# Patient Record
Sex: Male | Born: 1950 | Race: White | Hispanic: No | Marital: Married | Smoking: Former smoker
Health system: Southern US, Community
[De-identification: ages and names within clinical notes are randomized; demographics above are authoritative.]

## PROBLEM LIST (undated history)

## (undated) DIAGNOSIS — R972 Elevated prostate specific antigen [PSA]: Secondary | ICD-10-CM

## (undated) DIAGNOSIS — Z972 Presence of dental prosthetic device (complete) (partial): Secondary | ICD-10-CM

## (undated) DIAGNOSIS — N400 Enlarged prostate without lower urinary tract symptoms: Secondary | ICD-10-CM

## (undated) DIAGNOSIS — E785 Hyperlipidemia, unspecified: Secondary | ICD-10-CM

## (undated) HISTORY — PX: CHOLECYSTECTOMY: SHX55

## (undated) HISTORY — PX: HERNIA REPAIR: SHX51

## (undated) HISTORY — PX: OTHER SURGICAL HISTORY: SHX169

---

## 2002-01-17 ENCOUNTER — Encounter: Payer: Self-pay | Admitting: Gastroenterology

## 2002-01-17 ENCOUNTER — Ambulatory Visit (HOSPITAL_COMMUNITY): Admission: RE | Admit: 2002-01-17 | Discharge: 2002-01-17 | Payer: Self-pay | Admitting: Gastroenterology

## 2002-02-06 ENCOUNTER — Encounter (INDEPENDENT_AMBULATORY_CARE_PROVIDER_SITE_OTHER): Payer: Self-pay | Admitting: Specialist

## 2002-02-06 ENCOUNTER — Observation Stay (HOSPITAL_COMMUNITY): Admission: RE | Admit: 2002-02-06 | Discharge: 2002-02-07 | Payer: Self-pay | Admitting: Surgery

## 2002-02-06 ENCOUNTER — Encounter: Payer: Self-pay | Admitting: Surgery

## 2002-03-08 ENCOUNTER — Encounter (INDEPENDENT_AMBULATORY_CARE_PROVIDER_SITE_OTHER): Payer: Self-pay | Admitting: Specialist

## 2002-03-08 ENCOUNTER — Ambulatory Visit (HOSPITAL_COMMUNITY): Admission: RE | Admit: 2002-03-08 | Discharge: 2002-03-08 | Payer: Self-pay | Admitting: Gastroenterology

## 2002-06-27 ENCOUNTER — Ambulatory Visit (HOSPITAL_BASED_OUTPATIENT_CLINIC_OR_DEPARTMENT_OTHER): Admission: RE | Admit: 2002-06-27 | Discharge: 2002-06-28 | Payer: Self-pay | Admitting: Orthopaedic Surgery

## 2005-03-25 ENCOUNTER — Ambulatory Visit (HOSPITAL_COMMUNITY): Admission: RE | Admit: 2005-03-25 | Discharge: 2005-03-25 | Payer: Self-pay | Admitting: Gastroenterology

## 2009-03-18 ENCOUNTER — Ambulatory Visit (HOSPITAL_COMMUNITY): Admission: RE | Admit: 2009-03-18 | Discharge: 2009-03-18 | Payer: Self-pay | Admitting: Surgery

## 2011-02-03 LAB — BASIC METABOLIC PANEL
BUN: 17 mg/dL (ref 6–23)
CO2: 29 mEq/L (ref 19–32)
Calcium: 9.6 mg/dL (ref 8.4–10.5)
Chloride: 103 mEq/L (ref 96–112)
Creatinine, Ser: 0.96 mg/dL (ref 0.4–1.5)
GFR calc Af Amer: >60 mL/min (ref >60)
GFR calc non Af Amer: >60 mL/min (ref >60)
Glucose, Bld: 101 mg/dL — ABNORMAL HIGH (ref 70–99)
Potassium: 4.2 mEq/L (ref 3.5–5.1)
Sodium: 140 mEq/L (ref 135–145)

## 2011-02-03 LAB — DIFFERENTIAL
Basophils Absolute: 0 10*3/uL (ref 0.0–0.1)
Basophils Relative: 1 % (ref 0–1)
Eosinophils Absolute: 0.2 10*3/uL (ref 0.0–0.7)
Eosinophils Relative: 3 % (ref 0–5)
Lymphocytes Relative: 25 % (ref 12–46)
Lymphs Abs: 1.5 10*3/uL (ref 0.7–4.0)
Monocytes Absolute: 0.6 10*3/uL (ref 0.1–1.0)
Monocytes Relative: 9 % (ref 3–12)
Neutro Abs: 3.9 10*3/uL (ref 1.7–7.7)
Neutrophils Relative %: 62 % (ref 43–77)

## 2011-02-03 LAB — CBC
HCT: 47.3 % (ref 39.0–52.0)
Hemoglobin: 16.3 g/dL (ref 13.0–17.0)
MCHC: 34.5 g/dL (ref 30.0–36.0)
MCV: 92.1 fL (ref 78.0–100.0)
Platelets: 161 10*3/uL (ref 150–400)
RBC: 5.14 MIL/uL (ref 4.22–5.81)
RDW: 13 % (ref 11.5–15.5)
WBC: 6.3 10*3/uL (ref 4.0–10.5)

## 2011-03-10 NOTE — Op Note (Signed)
NAMECADEN, FUKUSHIMA NO.:  [id number]   MEDICAL RECORD NO.:  [id number]          PATIENT TYPE:  AMB   LOCATION:  SDS                          FACILITY:  [facility]   PHYSICIAN:  Wilmon Arms. Corliss Skains, M.D. DATE OF BIRTH:  1951-09-07   DATE OF PROCEDURE:  03/18/2009  DATE OF DISCHARGE:  03/18/2009                               OPERATIVE REPORT   PREOPERATIVE DIAGNOSIS:  Left inguinal hernia.   POSTOPERATIVE DIAGNOSIS:  Left inguinal hernia.   PROCEDURE PERFORMED:  Left inguinal hernia repair with mesh.   SURGEON:  Wilmon Arms. Tsuei, MD   ANESTHESIA:  General   INDICATIONS:  This is a 60 year old male who presents after doing some  heavy yard work, and noticed a pulling in his left groin.  He later  noticed a bulge in this area.  He was examined and was felt to have a  left inguinal hernia.  He presents now for elective repair.   DESCRIPTION OF PROCEDURE:  The patient was brought to the operating  room, placed in the supine position on operating table.  After an  adequate level of general anesthesia was obtained, the patient's left  groin was prepped with Betadine and draped in sterile fashion.  Unfortunately, the patient had previously shaved his own groin and had  some mild folliculitis around some of the hair follicles.  He was not  instructed to shave himself.  A time-out was taken to assure proper  patient, proper procedure.  We made an oblique incision above the left  inguinal ligament after infiltrating 0.25% Marcaine with epinephrine.  Dissection was carried down to the fascia.  The external oblique fascia  was opened along the direction of its fibers down to the external ring.  The spermatic cord was circumferentially dissected and was retracted  with a Penrose drain.  The floor of the inguinal canal was intact, but  was quite lax.  We skeletonized the spermatic cord.  A large indirect  hernia sac was identified and was dissected free and was reduced up  through the internal ring.  The internal ring was then tightened with a  figure-of-eight 2-0 Vicryl.  We then reinforced the floor of the  inguinal canal with a 0-PDS running suture.  UltraPro mesh was cut into  a keyhole shape and was secured with 2-0 Prolene beginning at the pubic  tubercle.  We ran this along the internal oblique fascia superiorly and  the shelving edge inferiorly.  The tails were sutured together and were  tucked underneath the external oblique fascia.  The external oblique  fascia was reapproximated with 2-0 Vicryl, 3-0 Vicryl was used to close  the subcutaneous tissues and 4-0 Monocryl was used to close the skin.  Steri-Strips and clean dressings were applied.  The patient was then  extubated and brought to recovery room in stable condition.  All sponge,  instrument, and needle counts were correct.      Wilmon Arms. Tsuei, M.D.  Electronically Signed    MKT/MEDQ  D:  03/18/2009  T:  03/19/2009  Job:  [id number]

## 2011-03-13 NOTE — Procedures (Signed)
[facility]  Patient:    AXIL, COPEMAN Visit Number: [id number] MRN: [id number]          Service Type: END Location: ENDO Attending Physician:  Charna Elizabeth Dictated by:   Anselmo Rod, M.D. Proc. Date: 03/08/02 Admit Date:  03/08/2002   CC:         Gabriel Earing, M.D.   Procedure Report  DATE OF BIRTH:  1951/10/21  REFERRING PHYSICIAN:  Gabriel Earing, M.D.  PROCEDURE PERFORMED:  Colonoscopy with snare polypectomy times two.  ENDOSCOPIST:  Anselmo Rod, M.D.  INSTRUMENT USED:  Olympus video colonoscope.  INDICATIONS FOR PROCEDURE:  Rectal bleeding in a 60 year old white male.  Rule out colonic polyps, masses, hemorrhoids, etc.  PREPROCEDURE PREPARATION:  Informed consent was procured from the patient. The patient was fasted for eight hours prior to the procedure and prepped with a bottle of magnesium citrate and a gallon of NuLytely the night prior to the procedure.  PREPROCEDURE PHYSICAL:  The patient had stable vital signs.  Neck supple. Chest clear to auscultation.  S1, S2 regular.  Abdomen soft with normal bowel sounds.  DESCRIPTION OF PROCEDURE:  The patient was placed in the left lateral decubitus position and sedated with 70 mg of Demerol and 6 mg of Versed intravenously.  Once the patient was adequately sedated and maintained on low-flow oxygen and continuous cardiac monitoring, the Olympus video colonoscope was advanced from the rectum to the cecum without difficulty.  The patient had two small rectal polyps that were snared.  Small internal hemorrhoids were seen on retroflexion in the rectum.  The rest of the colonic mucosa up to the cecum appeared healthy without lesions.  No masses or polyps were seen in the cecum, right colon, transverse colon or left colon.  The patient tolerated the procedure well without complications.  The appendiceal orifice and ileocecal valve were clearly visualized and  photographed.  There was no evidence of diverticulosis.  IMPRESSION: 1. Small nonbleeding internal hemorrhoids. 2. Two small sessile polyps snared from the rectum. 3. Normal-appearing left transverse right colon and cecum.  RECOMMENDATIONS: 1. Await pathology results. 2. Avoid all nonsteroidals including aspirin for the next four weeks. 3. High fiber diet. 4. Outpatient follow-up in the next two weeks.Dictated by:   Anselmo Rod, M.D. Attending Physician:  Charna Elizabeth DD:  03/08/02 TD:  03/09/02 Job: [id number]

## 2011-03-13 NOTE — Op Note (Signed)
NAMEVASCO, CHONG NO.:  [id number]   MEDICAL RECORD NO.:  [id number]          PATIENT TYPE:  AMB   LOCATION:  ENDO                         FACILITY:  [facility]   PHYSICIAN:  Anselmo Rod, M.D.  DATE OF BIRTH:  1951/08/10   DATE OF PROCEDURE:  03/25/2005  DATE OF DISCHARGE:                                 OPERATIVE REPORT   PROCEDURE PERFORMED:  Screening colonoscopy.   ENDOSCOPIST:  Anselmo Rod, M.D.   INSTRUMENT USED:  Olympus video colonoscope.   INDICATIONS FOR PROCEDURE:  A 60 year old white male with a history of  rectal bleeding undergoing a repeat colonoscopy to rule out colonic polyps,  masses, etc.   PREPROCEDURE PREPARATION:  Informed consent was procured from the patient.  The patient was fasted for 8 hours prior to the procedure and prepped with a  bottle of magnesium citrate and a gallon of GoLYTELY the night prior to the  procedure.  Risks and benefits of the procedure including a 10% miss rate of  cancer and polyps were discussed with the patient as well.   PREPROCEDURE PHYSICAL EXAMINATION:  VITAL SIGNS:  Stable.  NECK:  Supple.  CHEST:  Clear to auscultation.  S1 and S2 regular.  ABDOMEN:  Soft with normal bowel sounds.   DESCRIPTION OF PROCEDURE:  The patient was placed in the left lateral  decubitus position and sedated with 100 mg of Demerol and 6 mg of Versed in  slow incremental doses.  Once the patient was adequately sedated and  maintained on low flow oxygen and continuous cardiac monitoring, the Olympus  video colonoscope was advanced from the rectum to the cecum.  The  appendiceal orifice and the ileocecal valve were visualized and  photographed.  No masses, polyps, erosions, ulcerations, or diverticula were  seen.  The patient had some residual stool in the colon.  Multiple washings  were done.  Small internal hemorrhoids were appreciated on retroflexion in  the rectum.   IMPRESSION:  Normal colonoscopy up to the cecum  except for small internal  hemorrhoids.  No masses, polyps, or diverticula seen.   RECOMMENDATIONS:  A repeat colonoscopy has been recommended in the next 10  years unless the patient develops any abnormal symptoms in the interim.  Continue a high fiber diet with liberal fluid intake.  Outpatient follow-up  as need arises in the future.      JNM/MEDQ  D:  03/25/2005  T:  03/26/2005  Job:  [id number]   cc:   Gabriel Earing, M.D.  [address]  [place]  [place] [address]  Fax: [phone]

## 2011-03-13 NOTE — Op Note (Signed)
[facility]  Patient:    Michael Bentley, Michael Bentley Visit Number: [id number] MRN: [id number]          Service Type: SUR Location: 4W 4782 [id number] Attending Physician:  Shelly Rubenstein Dictated by:   Abigail Miyamoto, M.D. Proc. Date: 02/06/02 Admit Date:  02/06/2002                             Operative Report  PREOPERATIVE DIAGNOSIS:  Symptomatic cholelithiasis.  POSTOPERATIVE DIAGNOSIS:  Symptomatic cholelithiasis.  PROCEDURE:  Laparoscopic cholecystectomy with intraoperative cholangiogram.  SURGEON:  Abigail Miyamoto, M.D.  ASSISTANT:  Rose Phi. Maple Hudson, M.D.  ANESTHESIA:  General endotracheal anesthesia.  ESTIMATED BLOOD LOSS:  Minimal.  FINDINGS:  The patient was found to have findings consistent with chronic cholecystitis.  Cholangiogram was normal.  DESCRIPTION OF PROCEDURE:  The patient was brought to the operating room and identified as Michael Bentley.  He was placed supine on the operating table and general anesthesia was induced.  His abdomen was then prepped and draped in the usual sterile fashion.  Using a #15 blade, a small transverse incision was made below the umbilicus.  The incision was carried down to the fascia which was then opened with the scalpel.  A hemostat was used to divide into the peritoneal cavity.  A 0 Vicryl pursestring suture was placed around the fascial opening.  The Hasson port was placed into the opening and insufflation of the abdomen was begun.  An 11 mm port was placed in the patients epigastrium and two 5 mm ports were placed in the patients right flank under direct vision.  The gallbladder was then retracted above the liver bed. Dissection was then carried down to the base of the gallbladder.  The patient was found to have multiple adhesions to the gallbladder and these were taken down bluntly.  The cystic artery was found to be anterior, and clipped twice proximally and once distally, and transected with scissors.   The cystic artery was then identified, clipped once distally and then partly transected.  A cholangiocath was then inserted in the right upper quadrant and placed into the cystic duct.  The cholangiogram was performed under fluoroscopy and showed good flow of contrast into the duodenum as well as the proximal biliary radicles.  The cholangiocath was then removed.  The cystic stump was then clipped four times proximally and then transected with scissors.  The gallbladder was then slowly dissected free from the liver bed with the electrocautery.  Hemostasis was then achieved with the cautery.  The gallbladder was then removed through the port at the umbilicus.  A _________ was tied in place closing the fascial defect.  The abdomen was then copiously irrigated with normal saline.  Again, hemostasis appeared to be achieved.  All ports were then removed under direct vision and the abdomen was deflated.  All skin incisions were then anesthetized with 0.25% Marcaine and then closed with 4-0 Monocryl subcuticular sutures.  Steri-Strips, gauze, and tape were then applied.  The patient tolerated the procedure well.  All sponge, needle, and instrument counts were correct at the end of the procedure.  The patient was then extubated in the operating room and taken in stable condition to the recovery room. Dictated by:   Abigail Miyamoto, M.D. Attending Physician:  Shelly Rubenstein DD:  02/06/02 TD:  02/06/02 Job: [id number]

## 2011-03-13 NOTE — Op Note (Signed)
NAME:  Michael Bentley, Michael Bentley NO.:  [id number]   MEDICAL RECORD NO.:  [id number]                   PATIENT TYPE:  AMB   LOCATION:  DSC                                  FACILITY:  [facility]   PHYSICIAN:  Lubertha Basque. Jerl Santos, M.D.             DATE OF BIRTH:  1951/02/14   DATE OF PROCEDURE:  06/27/2002  DATE OF DISCHARGE:                                 OPERATIVE REPORT   PREOPERATIVE DIAGNOSES:  1. Right shoulder impingement.  2. Right shoulder adhesive capsulitis.   POSTOPERATIVE DIAGNOSES:  1. Right shoulder impingement.  2. Right shoulder adhesive capsulitis.   PROCEDURES:  1. Right shoulder manipulation.  2. Right shoulder arthroscopic acromioplasty.  3. Right shoulder arthroscopic debridement.  4. Right shoulder arthroscopic partial clavicectomy.   ANESTHESIA:  General and block.   SURGEON:  Lubertha Basque. Jerl Santos, M.D.   ASSISTANT:  Prince Rome, P.A.   INDICATIONS:  The patient is a 60 year old man with about a year of right  shoulder pain and stiffness.  This has persisted despite activity  restriction and exercise.  He has undergone a similar process with the  opposite side, which required two surgeries for a cure.  He is offered  operative intervention at this point to consist of an arthroscopy with a  manipulation.  The procedures were discussed with the patient, and informed  operative consent was obtained after discussion of the possible  complications of, reaction to anesthesia, and infection.  The probability of  a second surgery for a closed manipulation was also discussed.   DESCRIPTION OF PROCEDURE:  The patient was taken to the operative suite,  where a general anesthetic was applied after a block was given in the  preanesthesia area.  He was positioned in the beach chair position and  prepped and draped in the normal sterile fashion.  A manipulation of the  shoulder was done at the start of the case, and we took his forward  flexion  from about 80 up to 150 with several audible pops felt along the way.  His  external rotation also improved from about 0 to 55 degrees.  After the  administration of preop IV antibiotics, an arthroscopy of the right shoulder  was performed through a total of three portals.  The glenohumeral joint  showed no degenerative change, while the biceps tendon and the rotator cuff  appeared intact from below.  He did have some bands of scar tissue, which  were removed.  The subacromial space was then examined, and there were some  small bands of scar tissue here, but this was less prominent than in the  glenohumeral joint.  Also, it should be mentioned that the synovium about  the glenohumeral joint was not of the typical bright red nature which  frequently is seen in active adhesive capsulitis but was more dull and less  reactive.  In the subacromial space he  did have a prominent subacromial  spur, addressed with an acromioplasty back to a flat surface.  This was done  with the bur in the lateral position, followed by the transfer of the bur to  the posterior position. He also appeared to have some impingement from the  undersurface of the clavicle, and a partial clavicectomy was done without a  formal AC decompression.  The rotator cuff was thoroughly examined, and no  significant tear was found.  A bursectomy was performed through the scope.  The shoulder was then thoroughly irrigated, followed by the placement of  Marcaine with epinephrine and morphine.  Simple sutures of nylon were used  to loosely reapproximate the portals, followed by Adaptic and dry gauze with  tape.  Estimated blood loss and intraoperative fluids can be obtained from  the anesthesia records.   DISPOSITION:  The patient was extubated in the operating room and taken to  the recovery room in stable condition.  Plans were for him to stay overnight  for pain control and also to monitor his ability to void.  After  other  general anesthetic, he has required in-and-out catheterization for 24 hours.  I will check on the patient prior to discharge early in the morning.                                               Lubertha Basque Jerl Santos, M.D.    PGD/MEDQ  D:  06/27/2002  T:  06/27/2002  Job:  [id number]

## 2017-01-04 ENCOUNTER — Ambulatory Visit: Payer: Self-pay | Admitting: Surgery

## 2017-01-04 NOTE — H&P (Signed)
History of Present Illness Imogene Burn. Andreus Cure MD; 01/04/2017 11:31 AM) The patient is a 66 year old male who presents with an inguinal hernia. This is a 66 year old male in good health who is status post open repair of a large indirect left inguinal hernia with mesh on 03/18/09. The patient has been doing well for about 7 years. Last year, he was helping his wife move some furniture. Shortly thereafter he began noticing some mild discomfort in the left groin. This has fluctuated in severity. However more recently the discomfort has been more persistent. He feels some mild swelling in this area. He denies any umbilical pain or any right groin pain. He denies any obstructive symptoms.  He has had issues with urinary retention and all of his previous surgeries. Last time we put him on Flomax for a week prior to surgery and he had no urinary retention.   Past Surgical History Malachy Moan, [place]; 01/04/2017 10:28 AM) Colon Polyp Removal - Colonoscopy Open Inguinal Hernia Surgery Left. Shoulder Surgery Bilateral.  Diagnostic Studies History Malachy Moan, [place]; 01/04/2017 10:28 AM) Colonoscopy 1-5 years ago  Allergies Malachy Moan, RMA; 01/04/2017 10:30 AM) CeleBREX *ANALGESICS - ANTI-INFLAMMATORY* Nausea. Percocet *ANALGESICS - OPIOID* Nausea.  Medication History Malachy Moan, RMA; 01/04/2017 10:30 AM) Pravastatin Sodium (40MG  Tablet, Oral) Active. Medications Reconciled  Social History Malachy Moan, [place]; 01/04/2017 10:28 AM) Caffeine use Coffee, Tea. No alcohol use No drug use Tobacco use Former smoker.  Family History Malachy Moan, [place]; 01/04/2017 10:28 AM) Cerebrovascular Accident Mother. Heart Disease Mother.  Other Problems Malachy Moan, RMA; 01/04/2017 10:28 AM) Inguinal Hernia     Review of Systems Malachy Moan RMA; 01/04/2017 10:28 AM) General Not Present- Appetite Loss, Chills, Fatigue, Fever, Night Sweats, Weight Gain and  Weight Loss. Skin Not Present- Change in Wart/Mole, Dryness, Hives, Jaundice, New Lesions, Non-Healing Wounds, Rash and Ulcer. HEENT Not Present- Earache, Hearing Loss, Hoarseness, Nose Bleed, Oral Ulcers, Ringing in the Ears, Seasonal Allergies, Sinus Pain, Sore Throat, Visual Disturbances, Wears glasses/contact lenses and Yellow Eyes. Respiratory Not Present- Bloody sputum, Chronic Cough, Difficulty Breathing, Snoring and Wheezing. Breast Not Present- Breast Mass, Breast Pain, Nipple Discharge and Skin Changes. Cardiovascular Not Present- Chest Pain, Difficulty Breathing Lying Down, Leg Cramps, Palpitations, Rapid Heart Rate, Shortness of Breath and Swelling of Extremities. Gastrointestinal Not Present- Abdominal Pain, Bloating, Bloody Stool, Change in Bowel Habits, Chronic diarrhea, Constipation, Difficulty Swallowing, Excessive gas, Gets full quickly at meals, Hemorrhoids, Indigestion, Nausea, Rectal Pain and Vomiting. Male Genitourinary Not Present- Blood in Urine, Change in Urinary Stream, Frequency, Impotence, Nocturia, Painful Urination, Urgency and Urine Leakage. Musculoskeletal Not Present- Back Pain, Joint Pain, Joint Stiffness, Muscle Pain, Muscle Weakness and Swelling of Extremities. Neurological Not Present- Decreased Memory, Fainting, Headaches, Numbness, Seizures, Tingling, Tremor, Trouble walking and Weakness. Psychiatric Not Present- Anxiety, Bipolar, Change in Sleep Pattern, Depression, Fearful and Frequent crying. Endocrine Not Present- Cold Intolerance, Excessive Hunger, Hair Changes, Heat Intolerance, Hot flashes and New Diabetes. Hematology Present- Blood Thinners. Not Present- Easy Bruising, Excessive bleeding, Gland problems, HIV and Persistent Infections.  Vitals Malachy Moan RMA; 01/04/2017 10:31 AM) 01/04/2017 10:30 AM Weight: 194.2 lb Height: 75in Body Surface Area: 2.17 m Body Mass Index: 24.27 kg/m  Temp.: 97.22F  Pulse: 90 (Regular)  BP: 148/80  (Sitting, Left Arm, Standard)      Physical Exam Rodman Key K. Arieana Somoza MD; 01/04/2017 11:35 AM)  The physical exam findings are as follows: Note:WDWN in NAD Eyes: Pupils equal, round; sclera anicteric HENT: Oral mucosa moist; good  dentition Neck: No masses palpated, no thyromegaly Lungs: CTA bilaterally; normal respiratory effort CV: Regular rate and rhythm; no murmurs; extremities well-perfused with no edema Abd: +bowel sounds, soft, non-tender, no palpable organomegaly; small recurrent left inguinal hernia; no sign of right inguinal hernia Small asymptomatic reducible umbilical hernia Skin: Warm, dry; no sign of jaundice Psychiatric - alert and oriented x 4; calm mood and affect    Assessment & Plan Rodman Key K. Zenda Herskowitz MD; 01/04/2017 11:34 AM)  RECURRENT INGUINAL HERNIA OF LEFT SIDE WITHOUT OBSTRUCTION OR GANGRENE (K40.91)  Current Plans Schedule for Surgery - Laparoscopic left inguinal hernia repair with mesh. The surgical procedure has been discussed with the patient. Potential risks, benefits, alternative treatments, and expected outcomes have been explained. All of the patient's questions at this time have been answered. The likelihood of reaching the patient's treatment goal is good. The patient understand the proposed surgical procedure and wishes to proceed. Started Flomax 0.4MG , 1 (one) Capsule daily, #14, 14 days starting 01/04/2017, No Refill. Pt Education - NCCSRS: no at risk use: discussed with patient and provided information.  Imogene Burn. Georgette Dover, MD, [facility]  General/ Trauma Surgery  01/04/2017 11:36 AM

## 2017-02-03 NOTE — Pre-Procedure Instructions (Signed)
Rishan L Arscott  02/03/2017      [id number] - [place], [place] [address] Phone: [phone] Fax: [phone]    Your procedure is scheduled on Tues. Apr. 17 @ 215 PM  Report to [facility] at [address] PM.  Call this number if you have problems the morning of surgery:  [phone]   Remember:  Do not eat food or drink liquids after midnight.  Take these medicines the morning of surgery with A SIP OF WATER (none).  Take all other medications as prescribed except 7 days prior to surgery STOP taking any Aspirin, Aleve, Naproxen, Ibuprofen, Motrin, Advil, Goody's, BC's, all herbal medications, fish oil, and all vitamins   Do not wear jewelry, make-up or nail polish.  Do not wear lotions, powders, or perfumes, or deoderant.  Men may shave face and neck.  Do not bring valuables to the hospital.  [facility] is not responsible for any belongings or valuables.  Contacts, dentures or bridgework may not be worn into surgery.  Leave your suitcase in the car.  After surgery it may be brought to your room.  For patients admitted to the hospital, discharge time will be determined by your treatment team.  Patients discharged the day of surgery will not be allowed to drive home.    Special instructions:   - Preparing For Surgery  Before surgery, you can play an important role. Because skin is not sterile, your skin needs to be as free of germs as possible. You can reduce the number of germs on your skin by washing with CHG (chlorahexidine gluconate) Soap before surgery.  CHG is an antiseptic cleaner which kills germs and bonds with the skin to continue killing germs even after washing.  Please do not use if you have an allergy to CHG or antibacterial soaps. If your skin becomes reddened/irritated stop using the CHG.  Do not shave (including legs and underarms) for at least 48 hours prior to first CHG  shower. It is OK to shave your face.  Please follow these instructions carefully.   1. Shower the NIGHT BEFORE SURGERY and the MORNING OF SURGERY with CHG.   2. If you chose to wash your hair, wash your hair first as usual with your normal shampoo.  3. After you shampoo, rinse your hair and body thoroughly to remove the shampoo.  4. Use CHG as you would any other liquid soap. You can apply CHG directly to the skin and wash gently with a scrungie or a clean washcloth.   5. Apply the CHG Soap to your body ONLY FROM THE NECK DOWN.  Do not use on open wounds or open sores. Avoid contact with your eyes, ears, mouth and genitals (private parts). Wash genitals (private parts) with your normal soap.  6. Wash thoroughly, paying special attention to the area where your surgery will be performed.  7. Thoroughly rinse your body with warm water from the neck down.  8. DO NOT shower/wash with your normal soap after using and rinsing off the CHG Soap.  9. Pat yourself dry with a CLEAN TOWEL.   10. Wear CLEAN PAJAMAS   11. Place CLEAN SHEETS on your bed the night of your first shower and DO NOT SLEEP WITH PETS.   Day of Surgery: Do not apply any deodorants/lotions. Please wear clean clothes to the hospital/surgery center.    Please read over the following fact sheets that  you were given. Pain Booklet, Coughing and Deep Breathing and Surgical Site Infection Prevention

## 2017-02-04 ENCOUNTER — Encounter (HOSPITAL_COMMUNITY)
Admission: RE | Admit: 2017-02-04 | Discharge: 2017-02-04 | Disposition: A | Discharge status: Home / Self Care | Payer: Medicare Other | Source: Ambulatory Visit | Attending: Surgery | Admitting: Surgery

## 2017-02-04 ENCOUNTER — Encounter (HOSPITAL_COMMUNITY): Payer: Self-pay

## 2017-02-04 DIAGNOSIS — Z888 Allergy status to other drugs, medicaments and biological substances status: Secondary | ICD-10-CM | POA: Insufficient documentation

## 2017-02-04 DIAGNOSIS — Z87891 Personal history of nicotine dependence: Secondary | ICD-10-CM | POA: Insufficient documentation

## 2017-02-04 DIAGNOSIS — Z01812 Encounter for preprocedural laboratory examination: Secondary | ICD-10-CM | POA: Insufficient documentation

## 2017-02-04 DIAGNOSIS — Z8249 Family history of ischemic heart disease and other diseases of the circulatory system: Secondary | ICD-10-CM | POA: Diagnosis not present

## 2017-02-04 DIAGNOSIS — Z9889 Other specified postprocedural states: Secondary | ICD-10-CM | POA: Diagnosis not present

## 2017-02-04 DIAGNOSIS — K4091 Unilateral inguinal hernia, without obstruction or gangrene, recurrent: Secondary | ICD-10-CM | POA: Diagnosis not present

## 2017-02-04 DIAGNOSIS — Z79899 Other long term (current) drug therapy: Secondary | ICD-10-CM | POA: Diagnosis not present

## 2017-02-04 HISTORY — DX: Hyperlipidemia, unspecified: E78.5

## 2017-02-04 LAB — COMPREHENSIVE METABOLIC PANEL
ALT: 29 U/L (ref 17–63)
AST: 23 U/L (ref 15–41)
Albumin: 4.3 g/dL (ref 3.5–5.0)
Alkaline Phosphatase: 45 U/L (ref 38–126)
Anion gap: 8 (ref 5–15)
BUN: 24 mg/dL — AB (ref 6–20)
CHLORIDE: 104 mmol/L (ref 101–111)
CO2: 25 mmol/L (ref 22–32)
CREATININE: 1.15 mg/dL (ref 0.61–1.24)
Calcium: 9.2 mg/dL (ref 8.9–10.3)
GFR calc non Af Amer: >60 mL/min (ref >60)
Glucose, Bld: 101 mg/dL — ABNORMAL HIGH (ref 65–99)
POTASSIUM: 3.9 mmol/L (ref 3.5–5.1)
SODIUM: 137 mmol/L (ref 135–145)
Total Bilirubin: 2.5 mg/dL — ABNORMAL HIGH (ref 0.3–1.2)
Total Protein: 6.8 g/dL (ref 6.5–8.1)

## 2017-02-04 LAB — CBC
HEMATOCRIT: 47.1 % (ref 39.0–52.0)
Hemoglobin: 15.9 g/dL (ref 13.0–17.0)
MCH: 30.7 pg (ref 26.0–34.0)
MCHC: 33.8 g/dL (ref 30.0–36.0)
MCV: 90.9 fL (ref 78.0–100.0)
PLATELETS: 156 10*3/uL (ref 150–400)
RBC: 5.18 MIL/uL (ref 4.22–5.81)
RDW: 13.2 % (ref 11.5–15.5)
WBC: 5.9 10*3/uL (ref 4.0–10.5)

## 2017-02-04 MED ORDER — CHLORHEXIDINE GLUCONATE CLOTH 2 % EX PADS: 6.0000 | MEDICATED_PAD | Freq: Once | CUTANEOUS | Status: DC

## 2017-02-04 NOTE — Progress Notes (Addendum)
No history of cardiac problems. Does not see Cardiologist PCP Michael Bentley last visit May 2017 Denies any cardiac testing   Pt. States that he cannot take Celebrex.  Dr. Vonna Kotyk office notified

## 2017-02-05 ENCOUNTER — Ambulatory Visit: Payer: Self-pay | Admitting: Surgery

## 2017-02-09 ENCOUNTER — Ambulatory Visit (HOSPITAL_COMMUNITY): Payer: Medicare Other | Admitting: Anesthesiology

## 2017-02-09 ENCOUNTER — Ambulatory Visit (HOSPITAL_COMMUNITY)
Admission: RE | Admit: 2017-02-09 | Discharge: 2017-02-09 | Disposition: A | Discharge status: Home / Self Care | Payer: Medicare Other | Source: Ambulatory Visit | Attending: Surgery | Admitting: Surgery

## 2017-02-09 ENCOUNTER — Encounter (HOSPITAL_COMMUNITY)
Admission: RE | Disposition: A | Discharge status: Home / Self Care | Payer: Self-pay | Source: Ambulatory Visit | Attending: Surgery

## 2017-02-09 DIAGNOSIS — Z823 Family history of stroke: Secondary | ICD-10-CM | POA: Insufficient documentation

## 2017-02-09 DIAGNOSIS — Z885 Allergy status to narcotic agent status: Secondary | ICD-10-CM | POA: Diagnosis not present

## 2017-02-09 DIAGNOSIS — Z86010 Personal history of colonic polyps: Secondary | ICD-10-CM | POA: Diagnosis not present

## 2017-02-09 DIAGNOSIS — Z87891 Personal history of nicotine dependence: Secondary | ICD-10-CM | POA: Insufficient documentation

## 2017-02-09 DIAGNOSIS — K4091 Unilateral inguinal hernia, without obstruction or gangrene, recurrent: Secondary | ICD-10-CM | POA: Insufficient documentation

## 2017-02-09 DIAGNOSIS — Z888 Allergy status to other drugs, medicaments and biological substances status: Secondary | ICD-10-CM | POA: Diagnosis not present

## 2017-02-09 DIAGNOSIS — Z8249 Family history of ischemic heart disease and other diseases of the circulatory system: Secondary | ICD-10-CM | POA: Insufficient documentation

## 2017-02-09 HISTORY — PX: INSERTION OF MESH: SHX5868

## 2017-02-09 HISTORY — PX: INGUINAL HERNIA REPAIR: SHX194

## 2017-02-09 SURGERY — REPAIR, HERNIA, INGUINAL, LAPAROSCOPIC
Anesthesia: General | Site: Groin | Laterality: Left

## 2017-02-09 MED ORDER — ONDANSETRON HCL 4 MG/2ML IJ SOLN
INTRAMUSCULAR | Status: DC | PRN
Start: 2017-02-09 — End: 2017-02-09
  Administered 2017-02-09: 4 mg via INTRAVENOUS

## 2017-02-09 MED ORDER — LIDOCAINE 2% (20 MG/ML) 5 ML SYRINGE
INTRAMUSCULAR | Status: DC | PRN
  Administered 2017-02-09: 40 mg via INTRAVENOUS

## 2017-02-09 MED ORDER — ONDANSETRON HCL 4 MG/2ML IJ SOLN
INTRAMUSCULAR | Status: AC
  Filled 2017-02-09: qty 2

## 2017-02-09 MED ORDER — BUPIVACAINE HCL (PF) 0.25 % IJ SOLN
INTRAMUSCULAR | Status: AC
  Filled 2017-02-09: qty 30

## 2017-02-09 MED ORDER — MIDAZOLAM HCL 2 MG/2ML IJ SOLN
INTRAMUSCULAR | Status: AC
  Filled 2017-02-09: qty 2

## 2017-02-09 MED ORDER — BUPIVACAINE-EPINEPHRINE 0.25% -1:200000 IJ SOLN
INTRAMUSCULAR | Status: DC | PRN
  Administered 2017-02-09: 8 mL

## 2017-02-09 MED ORDER — MEPERIDINE HCL 25 MG/ML IJ SOLN: 6.2500 mg | INTRAMUSCULAR | Status: DC | PRN

## 2017-02-09 MED ORDER — ACETAMINOPHEN 500 MG PO TABS
1000.0000 mg | ORAL_TABLET | ORAL | Status: AC
  Administered 2017-02-09: 1000 mg via ORAL
  Filled 2017-02-09: qty 2

## 2017-02-09 MED ORDER — PROMETHAZINE HCL 25 MG/ML IJ SOLN: 6.2500 mg | INTRAMUSCULAR | Status: DC | PRN

## 2017-02-09 MED ORDER — MIDAZOLAM HCL 2 MG/2ML IJ SOLN: 0.5000 mg | Freq: Once | INTRAMUSCULAR | Status: DC | PRN

## 2017-02-09 MED ORDER — SUGAMMADEX SODIUM 200 MG/2ML IV SOLN
INTRAVENOUS | Status: AC
  Filled 2017-02-09: qty 2

## 2017-02-09 MED ORDER — GABAPENTIN 300 MG PO CAPS
300.0000 mg | ORAL_CAPSULE | ORAL | Status: AC
  Administered 2017-02-09: 300 mg via ORAL
  Filled 2017-02-09: qty 1

## 2017-02-09 MED ORDER — 0.9 % SODIUM CHLORIDE (POUR BTL) OPTIME
TOPICAL | Status: DC | PRN
  Administered 2017-02-09: 1000 mL

## 2017-02-09 MED ORDER — LACTATED RINGERS IV SOLN
INTRAVENOUS | Status: DC
  Administered 2017-02-09 (×2): via INTRAVENOUS

## 2017-02-09 MED ORDER — HYDROMORPHONE HCL 1 MG/ML IJ SOLN
INTRAMUSCULAR | Status: AC
  Filled 2017-02-09: qty 1

## 2017-02-09 MED ORDER — FENTANYL CITRATE (PF) 100 MCG/2ML IJ SOLN
INTRAMUSCULAR | Status: DC | PRN
  Administered 2017-02-09: 100 ug via INTRAVENOUS
  Administered 2017-02-09: 50 ug via INTRAVENOUS

## 2017-02-09 MED ORDER — ROCURONIUM BROMIDE 10 MG/ML (PF) SYRINGE
PREFILLED_SYRINGE | INTRAVENOUS | Status: DC | PRN
  Administered 2017-02-09: 50 mg via INTRAVENOUS

## 2017-02-09 MED ORDER — ROCURONIUM BROMIDE 50 MG/5ML IV SOSY
PREFILLED_SYRINGE | INTRAVENOUS | Status: AC
  Filled 2017-02-09: qty 5

## 2017-02-09 MED ORDER — LIDOCAINE 2% (20 MG/ML) 5 ML SYRINGE
INTRAMUSCULAR | Status: AC
  Filled 2017-02-09: qty 5

## 2017-02-09 MED ORDER — TRAMADOL HCL 50 MG PO TABS: 50.0000 mg | ORAL_TABLET | Freq: Four times a day (QID) | ORAL | 0 refills | Status: DC | PRN

## 2017-02-09 MED ORDER — SUGAMMADEX SODIUM 200 MG/2ML IV SOLN
INTRAVENOUS | Status: DC | PRN
  Administered 2017-02-09: 200 mg via INTRAVENOUS

## 2017-02-09 MED ORDER — CEFAZOLIN SODIUM-DEXTROSE 2-4 GM/100ML-% IV SOLN
2.0000 g | INTRAVENOUS | Status: AC
  Administered 2017-02-09: 2 g via INTRAVENOUS
  Filled 2017-02-09: qty 100

## 2017-02-09 MED ORDER — PROPOFOL 10 MG/ML IV BOLUS
INTRAVENOUS | Status: AC
  Filled 2017-02-09: qty 20

## 2017-02-09 MED ORDER — MIDAZOLAM HCL 5 MG/5ML IJ SOLN
INTRAMUSCULAR | Status: DC | PRN
  Administered 2017-02-09: 2 mg via INTRAVENOUS

## 2017-02-09 MED ORDER — PROPOFOL 10 MG/ML IV BOLUS
INTRAVENOUS | Status: DC | PRN
  Administered 2017-02-09: 200 mg via INTRAVENOUS

## 2017-02-09 MED ORDER — FENTANYL CITRATE (PF) 250 MCG/5ML IJ SOLN
INTRAMUSCULAR | Status: AC
  Filled 2017-02-09: qty 5

## 2017-02-09 MED ORDER — HYDROMORPHONE HCL 1 MG/ML IJ SOLN
0.2500 mg | INTRAMUSCULAR | Status: DC | PRN
  Administered 2017-02-09 (×2): 0.5 mg via INTRAVENOUS

## 2017-02-09 SURGICAL SUPPLY — 33 items
APPLIER CLIP LOGIC TI 5 (MISCELLANEOUS) IMPLANT
BENZOIN TINCTURE PRP APPL 2/3 (GAUZE/BANDAGES/DRESSINGS) ×4 IMPLANT
CANISTER SUCT 3000ML PPV (MISCELLANEOUS) IMPLANT
CLOSURE WOUND 1/2 X4 (GAUZE/BANDAGES/DRESSINGS) ×1
COVER SURGICAL LIGHT HANDLE (MISCELLANEOUS) ×4 IMPLANT
DEVICE SECURE STRAP 25 ABSORB (INSTRUMENTS) ×4 IMPLANT
DISSECT BALLN SPACEMKR + OVL (BALLOONS) ×4
DISSECTOR BALLN SPACEMKR + OVL (BALLOONS) ×2 IMPLANT
DISSECTOR BLUNT TIP ENDO 5MM (MISCELLANEOUS) IMPLANT
DRSG TEGADERM 2-3/8X2-3/4 SM (GAUZE/BANDAGES/DRESSINGS) ×8 IMPLANT
DRSG TEGADERM 4X4.75 (GAUZE/BANDAGES/DRESSINGS) ×4 IMPLANT
ELECT REM PT RETURN 9FT ADLT (ELECTROSURGICAL) ×4
ELECTRODE REM PT RTRN 9FT ADLT (ELECTROSURGICAL) ×2 IMPLANT
GLOVE BIO SURGEON STRL SZ7 (GLOVE) ×4 IMPLANT
GLOVE BIOGEL PI IND STRL 7.5 (GLOVE) ×2 IMPLANT
GLOVE BIOGEL PI INDICATOR 7.5 (GLOVE) ×2
GOWN STRL REUS W/ TWL LRG LVL3 (GOWN DISPOSABLE) ×4 IMPLANT
GOWN STRL REUS W/TWL LRG LVL3 (GOWN DISPOSABLE) ×4
KIT BASIN OR (CUSTOM PROCEDURE TRAY) ×4 IMPLANT
KIT ROOM TURNOVER OR (KITS) ×4 IMPLANT
MESH 3DMAX LIGHT 4.1X6.2 LT LR (Mesh General) ×4 IMPLANT
NS IRRIG 1000ML POUR BTL (IV SOLUTION) ×4 IMPLANT
PAD ARMBOARD 7.5X6 YLW CONV (MISCELLANEOUS) ×8 IMPLANT
SCISSORS LAP 5X35 DISP (ENDOMECHANICALS) ×4 IMPLANT
SET IRRIG TUBING LAPAROSCOPIC (IRRIGATION / IRRIGATOR) IMPLANT
SET TROCAR LAP APPLE-HUNT 5MM (ENDOMECHANICALS) ×4 IMPLANT
STRIP CLOSURE SKIN 1/2X4 (GAUZE/BANDAGES/DRESSINGS) ×3 IMPLANT
SUT MNCRL AB 4-0 PS2 18 (SUTURE) ×4 IMPLANT
TOWEL OR 17X24 6PK STRL BLUE (TOWEL DISPOSABLE) ×4 IMPLANT
TOWEL OR 17X26 10 PK STRL BLUE (TOWEL DISPOSABLE) ×4 IMPLANT
TRAY FOLEY CATH SILVER 16FR (SET/KITS/TRAYS/PACK) ×4 IMPLANT
TRAY LAPAROSCOPIC MC (CUSTOM PROCEDURE TRAY) ×4 IMPLANT
TUBING INSUFFLATION (TUBING) ×4 IMPLANT

## 2017-02-09 NOTE — Anesthesia Postprocedure Evaluation (Addendum)
Anesthesia Post Note  Patient: Michael Bentley  Procedure(s) Performed: Procedure(s) (LRB): LAPAROSCOPIC REPAIR OF RECURRENT LEFT INGUINAL HERNIA WITH MESH (Left) INSERTION OF MESH (Left)  Patient location during evaluation: PACU Anesthesia Type: General Level of consciousness: awake, awake and alert and oriented Pain management: pain level controlled Vital Signs Assessment: post-procedure vital signs reviewed and stable Respiratory status: spontaneous breathing, nonlabored ventilation and respiratory function stable Cardiovascular status: blood pressure returned to baseline Anesthetic complications: no       Last Vitals:  Vitals:   02/09/17 1604 02/09/17 1610  BP:  120/79  Pulse: 82 84  Resp: 15 16  Temp: 36.1 C     Last Pain:  Vitals:   02/09/17 1604  PainSc: 2                  Johnisha Louks COKER

## 2017-02-09 NOTE — Discharge Instructions (Signed)
[facility] ° °UMBILICAL OR INGUINAL HERNIA REPAIR: POST OP INSTRUCTIONS ° °Always review your discharge instruction sheet given to you by the facility where your surgery was performed. °IF YOU HAVE DISABILITY OR FAMILY LEAVE FORMS, YOU MUST BRING THEM TO THE OFFICE FOR PROCESSING.   °DO NOT GIVE THEM TO YOUR DOCTOR. ° °1. A  prescription for pain medication may be given to you upon discharge.  Take your pain medication as prescribed, if needed.  If narcotic pain medicine is not needed, then you may take acetaminophen (Tylenol) or ibuprofen (Advil) as needed. °2. Take your usually prescribed medications unless otherwise directed. °If you need a refill on your pain medication, please contact your pharmacy.  They will contact our office to request authorization. Prescriptions will not be filled after 5 pm or on week-ends. °3. You should follow a light diet the first 24 hours after arrival home, such as soup and crackers, etc.  Be sure to include lots of fluids daily.  Resume your normal diet the day after surgery. °4.Most patients will experience some swelling and bruising around the umbilicus or in the groin and scrotum.  Ice packs and reclining will help.  Swelling and bruising can take several days to resolve.  °6. It is common to experience some constipation if taking pain medication after surgery.  Increasing fluid intake and taking a stool softener (such as Colace) will usually help or prevent this problem from occurring.  A mild laxative (Milk of Magnesia or Miralax) should be taken according to package directions if there are no bowel movements after 48 hours. °7. Unless discharge instructions indicate otherwise, you may remove your bandages 24-48 hours after surgery, and you may shower at that time.  You may have steri-strips (small skin tapes) in place directly over the incision.  These strips should be left on the skin for 7-10 days.  If your surgeon used skin glue on the  incision, you may shower in 24 hours.  The glue will flake off over the next 2-3 weeks.  Any sutures or staples will be removed at the office during your follow-up visit. °8. ACTIVITIES:  You may resume regular (light) daily activities beginning the next day--such as daily self-care, walking, climbing stairs--gradually increasing activities as tolerated.  You may have sexual intercourse when it is comfortable.  Refrain from any heavy lifting or straining until approved by your doctor. ° °a.You may drive when you are no longer taking prescription pain medication, you can comfortably wear a seatbelt, and you can safely maneuver your car and apply brakes. °b.RETURN TO WORK:   °_____________________________________________ ° °9.You should see your doctor in the office for a follow-up appointment approximately 2-3 weeks after your surgery.  Make sure that you call for this appointment within a day or two after you arrive home to insure a convenient appointment time. °10.OTHER INSTRUCTIONS: _________________________ °   _____________________________________ ° °WHEN TO CALL YOUR DOCTOR: °1. Fever over 101.0 °2. Inability to urinate °3. Nausea and/or vomiting °4. Extreme swelling or bruising °5. Continued bleeding from incision. °6. Increased pain, redness, or drainage from the incision ° °The clinic staff is available to answer your questions during regular business hours.  Please don’t hesitate to call and ask to speak to one of the nurses for clinical concerns.  If you have a medical emergency, go to the nearest emergency room or call 911.  A surgeon from [facility] is always on call at the hospital ° ° °  [address], [address], [place], [place]  [address] ? ° [address] [address], [place], [place]   [address] °[phone] ? [phone] ? FAX [phone] °Web site: [email or web address] °

## 2017-02-09 NOTE — Op Note (Signed)
Prep diagnosis: Recurrent left inguinal hernia Postop diagnosis: Same Procedure performed: Laparoscopic left inguinal hernia repair with mesh Surgeon:Tashena Ibach K. Anesthesia: GETT Indications:  This is a 66 year old male in good health who presents 8 years after a open repair of a large indirect left inguinal hernia with mesh. The patient was doing well. Several months ago he was helping his wife move some heavy furniture. He noticed some pain in his left groin and subsequently developed a bulge in this area. He was examined and this was felt to be a recurrent left inguinal hernia. He presents now for laparoscopic repair.  Description of procedure: The patient is brought to the operating room placed in a supine position on the operating room table. After an adequate level of general anesthesia was obtained, Foley catheter was placed under sterile technique. The patient has been on Flomax for 1 week to try to avoid any postoperative urinary retention. His lower abdomen was shaved prepped with ChloraPrep and draped sterile fashion. A timeout was taken to ensure the proper patient and proper procedure. We infiltrated the area about 2 cm below the umbilicus with 8.25% Marcaine. A transverse incision was made just to the right of the midline. Dissection was carried down to the anterior rectus sheath. We incised the anterior rectus sheath and retracted the rectus muscle laterally. We entered the rectal muscular space. The Spacemaker balloon was then advanced in the right intramuscular space down to the symphysis pubis. We inflated the Spacemaker balloon and held in place for several minutes for some hemostasis. The balloon was then removed. We insufflated CO2 into the preperitoneal space maintaining a maximum pressure of 15 mmHg. The laparoscope was inserted. Two 5 mm ports were placed in the lower midline. Blunt dissection history used to open the preperitoneal space laterally at the anterosuperior iliac  spine. We dissected medially to the symphysis pubis. No direct defect was noted. We identified the spermatic cord and the inferior epigastric vessels. There is a moderate-sized indirect hernia sac that is densely adherent to the side of the spermatic cord. We dissected this free from the spermatic cord and reduced completely. We closely examined the right side. There is no sign of inguinal hernia on that side. We opened a left large size 3-D max light mesh. We inserted this in the preperitoneal space and unfurled in the preperitoneal space. We placed this over the hernia defect. The lower end of the mesh was tucked underneath the edge of the of the peritoneum. We used 3 separate secure strap tacks to secure the anterior edge of the mesh in place. We placed one at sepsis pubis and one on either side of the inferior epigastric vessels. The mesh seem to cover the indirect indirect spaces adequately. We slowly released our insufflation the a wall directly observing the mesh. The ports were removed. The fascia of the anterior rectus sheath was closed with 0 Vicryl. Incisions were closed with 4-0 Monocryl. Steri-Strips were applied. The Foley catheter was removed. The patient was extubated and brought to the recovery room in stable condition. All sponge, instrument, and needle counts are correct.  Imogene Burn. Georgette Dover, MD, [facility]  General/ Trauma Surgery  02/09/2017 3:22 PM

## 2017-02-09 NOTE — Transfer of Care (Signed)
Immediate Anesthesia Transfer of Care Note  Patient: Michael Bentley  Procedure(s) Performed: Procedure(s): LAPAROSCOPIC REPAIR OF RECURRENT LEFT INGUINAL HERNIA WITH MESH (Left) INSERTION OF MESH (Left)  Patient Location: PACU  Anesthesia Type:General  Level of Consciousness: drowsy and patient cooperative  Airway & Oxygen Therapy: Patient Spontanous Breathing and Patient connected to nasal cannula oxygen  Post-op Assessment: Report given to RN, Post -op Vital signs reviewed and stable and Patient moving all extremities  Post vital signs: Reviewed and stable  Last Vitals:  Vitals:   02/09/17 1211  BP: 129/86  Pulse: 75  Resp: 20  Temp: 36.7 C    Last Pain:  Vitals:   02/09/17 1221  PainSc: 0-No pain      Patients Stated Pain Goal: 1 (60/47/99 8721)  Complications: No apparent anesthesia complications

## 2017-02-09 NOTE — H&P (Signed)
History of Present Illness  The patient is a 66 year old male who presents with an inguinal hernia. This is a 66 year old male in good health who is status post open repair of a large indirect left inguinal hernia with mesh on 03/18/09. The patient has been doing well for about 7 years. Last year, he was helping his wife move some furniture. Shortly thereafter he began noticing some mild discomfort in the left groin. This has fluctuated in severity. However more recently the discomfort has been more persistent. He feels some mild swelling in this area. He denies any umbilical pain or any right groin pain. He denies any obstructive symptoms.  He has had issues with urinary retention and all of his previous surgeries. Last time we put him on Flomax for a week prior to surgery and he had no urinary retention.   Past Surgical History  Colon Polyp Removal - Colonoscopy Open Inguinal Hernia Surgery Left. Shoulder Surgery Bilateral.  Diagnostic Studies History  Colonoscopy 1-5 years ago  Allergies CeleBREX *ANALGESICS - ANTI-INFLAMMATORY* Nausea. Percocet *ANALGESICS - OPIOID* Nausea.  Medication History  Pravastatin Sodium (40MG  Tablet, Oral) Active. Medications Reconciled  Social History  Caffeine use Coffee, Tea. No alcohol use No drug use Tobacco use Former smoker.  Family History  Cerebrovascular Accident Mother. Heart Disease Mother.  Other Problems  Inguinal Hernia     Review of Systems  General Not Present- Appetite Loss, Chills, Fatigue, Fever, Night Sweats, Weight Gain and Weight Loss. Skin Not Present- Change in Wart/Mole, Dryness, Hives, Jaundice, New Lesions, Non-Healing Wounds, Rash and Ulcer. HEENT Not Present- Earache, Hearing Loss, Hoarseness, Nose Bleed, Oral Ulcers, Ringing in the Ears, Seasonal Allergies, Sinus Pain, Sore Throat, Visual Disturbances, Wears glasses/contact lenses and Yellow Eyes. Respiratory Not Present- Bloody  sputum, Chronic Cough, Difficulty Breathing, Snoring and Wheezing. Breast Not Present- Breast Mass, Breast Pain, Nipple Discharge and Skin Changes. Cardiovascular Not Present- Chest Pain, Difficulty Breathing Lying Down, Leg Cramps, Palpitations, Rapid Heart Rate, Shortness of Breath and Swelling of Extremities. Gastrointestinal Not Present- Abdominal Pain, Bloating, Bloody Stool, Change in Bowel Habits, Chronic diarrhea, Constipation, Difficulty Swallowing, Excessive gas, Gets full quickly at meals, Hemorrhoids, Indigestion, Nausea, Rectal Pain and Vomiting. Male Genitourinary Not Present- Blood in Urine, Change in Urinary Stream, Frequency, Impotence, Nocturia, Painful Urination, Urgency and Urine Leakage. Musculoskeletal Not Present- Back Pain, Joint Pain, Joint Stiffness, Muscle Pain, Muscle Weakness and Swelling of Extremities. Neurological Not Present- Decreased Memory, Fainting, Headaches, Numbness, Seizures, Tingling, Tremor, Trouble walking and Weakness. Psychiatric Not Present- Anxiety, Bipolar, Change in Sleep Pattern, Depression, Fearful and Frequent crying. Endocrine Not Present- Cold Intolerance, Excessive Hunger, Hair Changes, Heat Intolerance, Hot flashes and New Diabetes. Hematology Present- Blood Thinners. Not Present- Easy Bruising, Excessive bleeding, Gland problems, HIV and Persistent Infections.  Vitals  Weight: 194.2 lb Height: 75in Body Surface Area: 2.17 m Body Mass Index: 24.27 kg/m  Temp.: 97.78F  Pulse: 90 (Regular)  BP: 148/80 (Sitting, Left Arm, Standard)      Physical Exam   The physical exam findings are as follows: Note:WDWN in NAD Eyes: Pupils equal, round; sclera anicteric HENT: Oral mucosa moist; good dentition Neck: No masses palpated, no thyromegaly Lungs: CTA bilaterally; normal respiratory effort CV: Regular rate and rhythm; no murmurs; extremities well-perfused with no edema Abd: +bowel sounds, soft, non-tender, no  palpable organomegaly; small recurrent left inguinal hernia; no sign of right inguinal hernia Small asymptomatic reducible umbilical hernia Skin: Warm, dry; no sign of jaundice Psychiatric - alert and oriented  x 4; calm mood and affect    Assessment & Plan   RECURRENT INGUINAL HERNIA OF LEFT SIDE WITHOUT OBSTRUCTION OR GANGRENE (K40.91)  Current Plans Schedule for Surgery - Laparoscopic left inguinal hernia repair with mesh. The surgical procedure has been discussed with the patient. Potential risks, benefits, alternative treatments, and expected outcomes have been explained. All of the patient's questions at this time have been answered. The likelihood of reaching the patient's treatment goal is good. The patient understand the proposed surgical procedure and wishes to proceed. Started Flomax 0.4MG , 1 (one) Capsule daily, #14, 14 days starting 01/04/2017, No Refill. Pt Education - NCCSRS: no at risk use: discussed with patient and provided information.  Imogene Burn. Georgette Dover, MD, [facility]  General/ Trauma Surgery  02/09/2017 1:21 PM

## 2017-02-09 NOTE — Anesthesia Preprocedure Evaluation (Addendum)
Anesthesia Evaluation  Patient identified by MRN, date of birth, ID band Patient awake    Reviewed: Allergy & Precautions, NPO status , Patient's Chart, lab work & pertinent test results  History of Anesthesia Complications Negative for: history of anesthetic complications  Airway Mallampati: I  TM Distance: >3 FB Neck ROM: Full    Dental  (+) Edentulous Upper, Dental Advisory Given   Pulmonary former smoker,    breath sounds clear to auscultation       Cardiovascular negative cardio ROS   Rhythm:Regular Rate:Normal     Neuro/Psych negative neurological ROS     GI/Hepatic negative GI ROS, Neg liver ROS,   Endo/Other  negative endocrine ROS  Renal/GU negative Renal ROS     Musculoskeletal   Abdominal   Peds  Hematology negative hematology ROS (+)   Anesthesia Other Findings   Reproductive/Obstetrics                            Anesthesia Physical Anesthesia Plan  ASA: II  Anesthesia Plan: General   Post-op Pain Management:    Induction: Intravenous  Airway Management Planned: Oral ETT  Additional Equipment:   Intra-op Plan:   Post-operative Plan: Extubation in OR  Informed Consent: I have reviewed the patients History and Physical, chart, labs and discussed the procedure including the risks, benefits and alternatives for the proposed anesthesia with the patient or authorized representative who has indicated his/her understanding and acceptance.   Dental advisory given  Plan Discussed with: CRNA and Surgeon  Anesthesia Plan Comments: (Plan routine monitors, GETA)        Anesthesia Quick Evaluation

## 2017-02-09 NOTE — Anesthesia Procedure Notes (Signed)
Procedure Name: Intubation Date/Time: 02/09/2017 2:05 PM Performed by: Melina Copa, Hallis Meditz R Pre-anesthesia Checklist: Patient identified, Emergency Drugs available, Suction available and Patient being monitored Patient Re-evaluated:Patient Re-evaluated prior to inductionOxygen Delivery Method: Circle System Utilized Preoxygenation: Pre-oxygenation with 100% oxygen Intubation Type: IV induction Ventilation: Mask ventilation without difficulty Laryngoscope Size: Mac and 4 Grade View: Grade I Tube type: Oral Tube size: 8.0 mm Number of attempts: 1 Airway Equipment and Method: Stylet and Oral airway Placement Confirmation: ETT inserted through vocal cords under direct vision,  positive ETCO2 and breath sounds checked- equal and bilateral Secured at: 22 cm Tube secured with: Tape Dental Injury: Teeth and Oropharynx as per pre-operative assessment

## 2017-02-10 ENCOUNTER — Encounter (HOSPITAL_COMMUNITY): Payer: Self-pay | Admitting: Surgery

## 2017-06-17 NOTE — Addendum Note (Signed)
Addendum  created 06/17/17 1312 by Roberts Gaudy, MD   Sign clinical note

## 2020-04-24 ENCOUNTER — Other Ambulatory Visit: Payer: Self-pay | Admitting: Urology

## 2020-04-24 DIAGNOSIS — R972 Elevated prostate specific antigen [PSA]: Secondary | ICD-10-CM

## 2020-04-24 DIAGNOSIS — Z77018 Contact with and (suspected) exposure to other hazardous metals: Secondary | ICD-10-CM

## 2020-05-17 ENCOUNTER — Ambulatory Visit
Admission: RE | Admit: 2020-05-17 | Discharge: 2020-05-17 | Disposition: A | Discharge status: Home / Self Care | Payer: Medicare PPO | Source: Ambulatory Visit | Attending: Urology | Admitting: Urology

## 2020-05-17 ENCOUNTER — Other Ambulatory Visit: Payer: Self-pay

## 2020-05-17 DIAGNOSIS — Z77018 Contact with and (suspected) exposure to other hazardous metals: Secondary | ICD-10-CM

## 2020-05-17 DIAGNOSIS — R972 Elevated prostate specific antigen [PSA]: Secondary | ICD-10-CM

## 2020-05-17 MED ORDER — GADOBENATE DIMEGLUMINE 529 MG/ML IV SOLN
17.0000 mL | Freq: Once | INTRAVENOUS | Status: AC | PRN
  Administered 2020-05-17: 17 mL via INTRAVENOUS

## 2021-02-17 ENCOUNTER — Other Ambulatory Visit: Payer: Self-pay | Admitting: Urology

## 2021-02-19 ENCOUNTER — Other Ambulatory Visit: Payer: Self-pay | Admitting: Urology

## 2021-02-19 DIAGNOSIS — C61 Malignant neoplasm of prostate: Secondary | ICD-10-CM

## 2021-02-19 MED ORDER — FLEET ENEMA 7-19 GM/118ML RE ENEM: 1.0000 | ENEMA | Freq: Once | RECTAL | Status: AC

## 2021-02-28 ENCOUNTER — Encounter (HOSPITAL_BASED_OUTPATIENT_CLINIC_OR_DEPARTMENT_OTHER): Payer: Self-pay | Admitting: Urology

## 2021-02-28 ENCOUNTER — Other Ambulatory Visit: Payer: Self-pay

## 2021-02-28 NOTE — Progress Notes (Signed)
Spoke w/ via phone for pre-op interview--- Pt Lab needs dos----  no             Lab results------ no COVID test ------ 03-03-2021 @ 1000 Arrive at ------- 1130 on 03-05-2021 NPO after MN NO Solid Food.  Clear liquids from MN until--- 1030 Med rec completed Medications to take morning of surgery ----- NONE Diabetic medication ----- n/a Patient instructed to bring photo id and insurance card day of surgery Patient aware to have Driver (ride ) / caregiver    for 24 hours after surgery --wife, Michael Bentley Patient Special Instructions ----- n/a Pre-Op special Istructions ----- n/a Patient verbalized understanding of instructions that were given at this phone interview. Patient denies shortness of breath, chest pain, fever, cough at this phone interview.

## 2021-03-03 ENCOUNTER — Other Ambulatory Visit (HOSPITAL_COMMUNITY)
Admission: RE | Admit: 2021-03-03 | Discharge: 2021-03-03 | Disposition: A | Discharge status: Home / Self Care | Payer: Medicare PPO | Source: Ambulatory Visit | Attending: Urology | Admitting: Urology

## 2021-03-03 DIAGNOSIS — Z01812 Encounter for preprocedural laboratory examination: Secondary | ICD-10-CM | POA: Insufficient documentation

## 2021-03-03 DIAGNOSIS — Z20822 Contact with and (suspected) exposure to covid-19: Secondary | ICD-10-CM | POA: Diagnosis not present

## 2021-03-03 LAB — SARS CORONAVIRUS 2 (TAT 6-24 HRS): SARS Coronavirus 2: NEGATIVE

## 2021-03-04 ENCOUNTER — Other Ambulatory Visit: Payer: Self-pay | Admitting: Urology

## 2021-03-04 DIAGNOSIS — C61 Malignant neoplasm of prostate: Secondary | ICD-10-CM

## 2021-03-05 ENCOUNTER — Ambulatory Visit (HOSPITAL_BASED_OUTPATIENT_CLINIC_OR_DEPARTMENT_OTHER): Payer: Medicare PPO | Admitting: Anesthesiology

## 2021-03-05 ENCOUNTER — Ambulatory Visit (HOSPITAL_BASED_OUTPATIENT_CLINIC_OR_DEPARTMENT_OTHER)
Admission: RE | Admit: 2021-03-05 | Discharge: 2021-03-05 | Disposition: A | Discharge status: Home / Self Care | Payer: Medicare PPO | Attending: Urology | Admitting: Urology

## 2021-03-05 ENCOUNTER — Ambulatory Visit (HOSPITAL_COMMUNITY)
Admission: RE | Admit: 2021-03-05 | Discharge: 2021-03-05 | Disposition: A | Discharge status: Home / Self Care | Payer: Medicare PPO | Source: Ambulatory Visit | Attending: Urology | Admitting: Urology

## 2021-03-05 ENCOUNTER — Encounter (HOSPITAL_BASED_OUTPATIENT_CLINIC_OR_DEPARTMENT_OTHER): Payer: Self-pay | Admitting: Urology

## 2021-03-05 ENCOUNTER — Encounter (HOSPITAL_BASED_OUTPATIENT_CLINIC_OR_DEPARTMENT_OTHER)
Admission: RE | Disposition: A | Discharge status: Home / Self Care | Payer: Self-pay | Source: Home / Self Care | Attending: Urology

## 2021-03-05 DIAGNOSIS — Z87891 Personal history of nicotine dependence: Secondary | ICD-10-CM | POA: Insufficient documentation

## 2021-03-05 DIAGNOSIS — R972 Elevated prostate specific antigen [PSA]: Secondary | ICD-10-CM | POA: Diagnosis present

## 2021-03-05 DIAGNOSIS — C61 Malignant neoplasm of prostate: Secondary | ICD-10-CM

## 2021-03-05 DIAGNOSIS — Z888 Allergy status to other drugs, medicaments and biological substances status: Secondary | ICD-10-CM | POA: Insufficient documentation

## 2021-03-05 DIAGNOSIS — Z886 Allergy status to analgesic agent status: Secondary | ICD-10-CM | POA: Diagnosis not present

## 2021-03-05 DIAGNOSIS — R350 Frequency of micturition: Secondary | ICD-10-CM | POA: Diagnosis not present

## 2021-03-05 DIAGNOSIS — R3915 Urgency of urination: Secondary | ICD-10-CM | POA: Insufficient documentation

## 2021-03-05 DIAGNOSIS — R351 Nocturia: Secondary | ICD-10-CM | POA: Insufficient documentation

## 2021-03-05 DIAGNOSIS — N401 Enlarged prostate with lower urinary tract symptoms: Secondary | ICD-10-CM | POA: Diagnosis not present

## 2021-03-05 DIAGNOSIS — Z79899 Other long term (current) drug therapy: Secondary | ICD-10-CM | POA: Diagnosis not present

## 2021-03-05 DIAGNOSIS — Z885 Allergy status to narcotic agent status: Secondary | ICD-10-CM | POA: Insufficient documentation

## 2021-03-05 HISTORY — PX: PROSTATE BIOPSY: SHX241

## 2021-03-05 HISTORY — DX: Benign prostatic hyperplasia without lower urinary tract symptoms: N40.0

## 2021-03-05 HISTORY — DX: Presence of dental prosthetic device (complete) (partial): Z97.2

## 2021-03-05 HISTORY — DX: Elevated prostate specific antigen (PSA): R97.20

## 2021-03-05 LAB — URINALYSIS, ROUTINE W REFLEX MICROSCOPIC
Bilirubin Urine: NEGATIVE
Glucose, UA: NEGATIVE mg/dL
Hgb urine dipstick: NEGATIVE
Ketones, ur: NEGATIVE mg/dL
Leukocytes,Ua: NEGATIVE
Nitrite: NEGATIVE
Protein, ur: NEGATIVE mg/dL
Specific Gravity, Urine: 1.02 (ref 1.005–1.030)
pH: 5 (ref 5.0–8.0)

## 2021-03-05 SURGERY — BIOPSY, PROSTATE, RECTAL APPROACH, WITH US GUIDANCE
Anesthesia: Monitor Anesthesia Care | Site: Prostate

## 2021-03-05 MED ORDER — PROMETHAZINE HCL 25 MG/ML IJ SOLN: 6.2500 mg | INTRAMUSCULAR | Status: DC | PRN

## 2021-03-05 MED ORDER — LIDOCAINE 2% (20 MG/ML) 5 ML SYRINGE
INTRAMUSCULAR | Status: AC
  Filled 2021-03-05: qty 5

## 2021-03-05 MED ORDER — FENTANYL CITRATE (PF) 100 MCG/2ML IJ SOLN: 25.0000 ug | INTRAMUSCULAR | Status: DC | PRN

## 2021-03-05 MED ORDER — MIDAZOLAM HCL 5 MG/5ML IJ SOLN
INTRAMUSCULAR | Status: DC | PRN
  Administered 2021-03-05: 2 mg via INTRAVENOUS

## 2021-03-05 MED ORDER — PROPOFOL 500 MG/50ML IV EMUL
INTRAVENOUS | Status: DC | PRN
  Administered 2021-03-05: 50 ug/kg/min via INTRAVENOUS

## 2021-03-05 MED ORDER — LIDOCAINE HCL (CARDIAC) PF 100 MG/5ML IV SOSY
PREFILLED_SYRINGE | INTRAVENOUS | Status: DC | PRN
  Administered 2021-03-05: 40 mg via INTRAVENOUS

## 2021-03-05 MED ORDER — BELLADONNA ALKALOIDS-OPIUM 16.2-60 MG RE SUPP
RECTAL | Status: DC | PRN
  Administered 2021-03-05: 1 via RECTAL

## 2021-03-05 MED ORDER — ONDANSETRON HCL 4 MG/2ML IJ SOLN
INTRAMUSCULAR | Status: DC | PRN
  Administered 2021-03-05: 4 mg via INTRAVENOUS

## 2021-03-05 MED ORDER — MIDAZOLAM HCL 2 MG/2ML IJ SOLN
INTRAMUSCULAR | Status: AC
  Filled 2021-03-05: qty 2

## 2021-03-05 MED ORDER — FENTANYL CITRATE (PF) 100 MCG/2ML IJ SOLN
INTRAMUSCULAR | Status: DC | PRN
  Administered 2021-03-05 (×4): 25 ug via INTRAVENOUS

## 2021-03-05 MED ORDER — LACTATED RINGERS IV SOLN: INTRAVENOUS | Status: DC

## 2021-03-05 MED ORDER — LIDOCAINE HCL 2 % IJ SOLN
INTRAMUSCULAR | Status: DC | PRN
  Administered 2021-03-05: 10 mL

## 2021-03-05 MED ORDER — FENTANYL CITRATE (PF) 100 MCG/2ML IJ SOLN
INTRAMUSCULAR | Status: AC
  Filled 2021-03-05: qty 2

## 2021-03-05 MED ORDER — ONDANSETRON HCL 4 MG/2ML IJ SOLN
INTRAMUSCULAR | Status: AC
  Filled 2021-03-05: qty 2

## 2021-03-05 MED ORDER — CIPROFLOXACIN IN D5W 400 MG/200ML IV SOLN
400.0000 mg | INTRAVENOUS | Status: AC
  Administered 2021-03-05: 400 mg via INTRAVENOUS

## 2021-03-05 MED ORDER — CIPROFLOXACIN IN D5W 400 MG/200ML IV SOLN
INTRAVENOUS | Status: AC
  Filled 2021-03-05: qty 200

## 2021-03-05 MED ORDER — ACETAMINOPHEN 500 MG PO TABS: 1000.0000 mg | ORAL_TABLET | Freq: Once | ORAL | Status: DC | PRN

## 2021-03-05 MED ORDER — PROPOFOL 500 MG/50ML IV EMUL
INTRAVENOUS | Status: AC
  Filled 2021-03-05: qty 50

## 2021-03-05 SURGICAL SUPPLY — 11 items
CLOTH BEACON ORANGE TIMEOUT ST (SAFETY) ×2 IMPLANT
INST BIOPSY MAXCORE 18GX25 (NEEDLE) IMPLANT
INSTR BIOPSY MAXCORE 18GX20 (NEEDLE) ×2 IMPLANT
KIT TURNOVER CYSTO (KITS) ×2 IMPLANT
NDL SAFETY ECLIPSE 18X1.5 (NEEDLE) IMPLANT
NEEDLE HYPO 18GX1.5 SHARP (NEEDLE)
NEEDLE SPNL 22GX7 QUINCKE BK (NEEDLE) ×2 IMPLANT
NS IRRIG 500ML POUR BTL (IV SOLUTION) ×2 IMPLANT
PAD PREP 24X48 CUFFED NSTRL (MISCELLANEOUS) ×2 IMPLANT
SYR CONTROL 10ML LL (SYRINGE) IMPLANT
UNDERPAD 30X36 HEAVY ABSORB (UNDERPADS AND DIAPERS) ×4 IMPLANT

## 2021-03-05 NOTE — H&P (Signed)
Urology Preoperative H&P   Chief Complaint: Elevated PSA   History of Present Illness: Michael Bentley is a 70 y.o. male with a history of BPH w/o LUTS, ED (tx w/ sildenafil) and an elevated PSA.   Last PSA- 14.4 (01/2021), 5.42 (07/2020), 5.05 (01/2020), 7.02 (10/2019), 3.75 (10/2018), 4.3 (04/11/2018), 3.23 (06/2017). mpMRI from 04/2020 showed no high risk lesions. The patient has deferred a prostate biopsy for further investigation of his elevated PSA.  Prostate volume- 99 cm^3   From a urinary standpoint, he reports a good FOS and feels like he is emptying his bladder well. He has occasional urgency/frequency, but is not bothered by it. Nocturia x 1.  Preoperative urine culture revealed a pansensitive E. coli UTI, that the patient was largely asymptomatic from.  He was treated with an appropriate course of Cipro.  Urinalysis today is clear.  SHIM- 25  AUASS- 2/1    Past Medical History:  Diagnosis Date  . BPH (benign prostatic hyperplasia)   . Elevated PSA   . Hyperlipidemia   . Wears dentures    upper    Past Surgical History:  Procedure Laterality Date  . COLONOSCOPY  last one  2017 approx  . INGUINAL HERNIA REPAIR Left 02/09/2017   Procedure: LAPAROSCOPIC REPAIR OF RECURRENT LEFT INGUINAL HERNIA WITH MESH;  Surgeon: Donnie Mesa, MD;  Location: [facility];  Service: General;  Laterality: Left;  . INGUINAL HERNIA REPAIR Left 03-18-2009  @ [facility]  . INSERTION OF MESH Left 02/09/2017   Procedure: INSERTION OF MESH;  Surgeon: Donnie Mesa, MD;  Location: [place];  Service: General;  Laterality: Left;  . LAPAROSCOPIC CHOLECYSTECTOMY  02-06-2002  @[facility]   . SHOULDER ARTHROSCOPY Bilateral last one right side 2003  . TONSILLECTOMY  child    Allergies:  Allergies  Allergen Reactions  . Celebrex [Celecoxib] Nausea And Vomiting  . Oxycodone Nausea Only  . Tramadol Nausea And Vomiting  . Tyloxapol Nausea Only    History reviewed. No pertinent family history.  Social History:  reports that he  quit smoking about 43 years ago. His smoking use included cigarettes. He has a 13.50 pack-year smoking history. He has never used smokeless tobacco. He reports that he does not drink alcohol and does not use drugs.  ROS: A complete review of systems was performed.  All systems are negative except for pertinent findings as noted.  Physical Exam:  Vital signs in last 24 hours: Temp:  [97.5 F (36.4 C)] 97.5 F (36.4 C) (05/11 1041) Pulse Rate:  [89] 89 (05/11 1041) Resp:  [15] 15 (05/11 1041) BP: (132)/(93) 132/93 (05/11 1041) SpO2:  [98 %] 98 % (05/11 1041) Weight:  [84.7 kg] 84.7 kg (05/11 1041) Constitutional:  Alert and oriented, No acute distress Cardiovascular: Regular rate and rhythm, No JVD Respiratory: Normal respiratory effort, Lungs clear bilaterally GI: Abdomen is soft, nontender, nondistended, no abdominal masses GU: No CVA tenderness Lymphatic: No lymphadenopathy Neurologic: Grossly intact, no focal deficits Psychiatric: Normal mood and affect  Laboratory Data:  No results for input(s): WBC, HGB, HCT, PLT in the last 72 hours.  No results for input(s): NA, K, CL, GLUCOSE, BUN, CALCIUM, CREATININE in the last 72 hours.  Invalid input(s): CO3   Results for orders placed or performed during the hospital encounter of 03/05/21 (from the past 24 hour(s))  Urinalysis, Routine w reflex microscopic     Status: None   Collection Time: 03/05/21 11:35 AM  Result Value Ref Range   Color, Urine YELLOW YELLOW  APPearance CLEAR CLEAR   Specific Gravity, Urine 1.020 1.005 - 1.030   pH 5.0 5.0 - 8.0   Glucose, UA NEGATIVE NEGATIVE mg/dL   Hgb urine dipstick NEGATIVE NEGATIVE   Bilirubin Urine NEGATIVE NEGATIVE   Ketones, ur NEGATIVE NEGATIVE mg/dL   Protein, ur NEGATIVE NEGATIVE mg/dL   Nitrite NEGATIVE NEGATIVE   Leukocytes,Ua NEGATIVE NEGATIVE   Recent Results (from the past 240 hour(s))  SARS CORONAVIRUS 2 (TAT 6-24 HRS) Nasopharyngeal Nasopharyngeal Swab     Status:  None   Collection Time: 03/03/21 10:58 AM   Specimen: Nasopharyngeal Swab  Result Value Ref Range Status   SARS Coronavirus 2 NEGATIVE NEGATIVE Final    Comment: (NOTE) SARS-CoV-2 target nucleic acids are NOT DETECTED.  The SARS-CoV-2 RNA is generally detectable in upper and lower respiratory specimens during the acute phase of infection. Negative results do not preclude SARS-CoV-2 infection, do not rule out co-infections with other pathogens, and should not be used as the sole basis for treatment or other patient management decisions. Negative results must be combined with clinical observations, patient history, and epidemiological information. The expected result is Negative.  Fact Sheet for Patients: SugarRoll.be  Fact Sheet for Healthcare Providers: https://www.woods-mathews.com/  This test is not yet approved or cleared by the [place] FDA and  has been authorized for detection and/or diagnosis of SARS-CoV-2 by FDA under an Emergency Use Authorization (EUA). This EUA will remain  in effect (meaning this test can be used) for the duration of the COVID-19 declaration under Se ction 564(b)(1) of the Act, 21 U.S.C. section 360bbb-3(b)(1), unless the authorization is terminated or revoked sooner.  Performed at [facility], [place] [address] [place], [place] [address]     Renal Function: No results for input(s): CREATININE in the last 168 hours. CrCl cannot be calculated (Patient's most recent lab result is older than the maximum 21 days allowed.).  Radiologic Imaging: CLINICAL DATA: Elevated PSA   EXAM:  MR PROSTATE WITHOUT AND WITH CONTRAST   TECHNIQUE:  Multiplanar multisequence MRI images were obtained of the pelvis  centered about the prostate. Pre and post contrast images were  obtained.   CONTRAST: 55mL MULTIHANCE GADOBENATE DIMEGLUMINE 529 MG/ML IV SOLN   COMPARISON: None   FINDINGS:  Prostate: Prostate  signs of BPH and prostatitis.   Peripheral zone: Signs of prostatitis in the peripheral zone with  areas of wedge-shaped and linear hypointensity slightly greater on  the LEFT than the RIGHT.   Transitional zone: Expansion of the transitional zone with nodular  characteristics compatible with BPH   Volume: (Volume = 99 cc)   Transcapsular spread: Absent   Seminal vesicle involvement: Absent   Neurovascular bundle involvement: Absent   Pelvic adenopathy: Absent   Bone metastasis: Absent   Other findings: None   IMPRESSION:  No signs of high-risk lesion in the prostate with changes of BPH and  prostatitis.    Electronically Signed  By: Zetta Bills M.D.  On: 05/20/2020 08:35  I independently reviewed the above imaging studies.  Assessment and Plan Michael Bentley is a 70 y.o. male with a history of an elevated PSA  -We discussed the general pros and cons of PSA screening, with the benefits being early cancer detection, hopefully allowing for treatment while curable, and with the cons being anxiety and need for further invasive testing (prostate biopsy) and treatment which may pose more substantial risks. We discussed specific pros/cons in direct relationship to his overall health status and life  expectancy.   I extensively discussed the option of prostate biopsy.  I specifically discussed potential risks including but not limited to the possibility of blood per rectum, per urethra and in the ejaculate, urinary retention and the possibility for UTI/bacteremia. He voices understanding and wishes to proceed with prostate biopsy under anesthesia.   Ellison Hughs, MD 03/05/2021, 1:18 PM  [facility] Pager: [phone]

## 2021-03-05 NOTE — Anesthesia Preprocedure Evaluation (Addendum)
Anesthesia Evaluation  Patient identified by MRN, date of birth, ID band Patient awake    Reviewed: Allergy & Precautions, NPO status , Patient's Chart, lab work & pertinent test results  Airway Mallampati: I  TM Distance: >3 FB Neck ROM: Full    Dental no notable dental hx.    Pulmonary neg pulmonary ROS, former smoker,    Pulmonary exam normal breath sounds clear to auscultation       Cardiovascular negative cardio ROS Normal cardiovascular exam Rhythm:Regular Rate:Normal     Neuro/Psych negative neurological ROS  negative psych ROS   GI/Hepatic negative GI ROS, Neg liver ROS,   Endo/Other  negative endocrine ROS  Renal/GU negative Renal ROS     Musculoskeletal negative musculoskeletal ROS (+)   Abdominal   Peds negative pediatric ROS (+)  Hematology negative hematology ROS (+)   Anesthesia Other Findings BPH, elevated PSA  Reproductive/Obstetrics negative OB ROS                            Anesthesia Physical Anesthesia Plan  ASA: II  Anesthesia Plan: MAC   Post-op Pain Management:    Induction: Intravenous  PONV Risk Score and Plan: 1 and Propofol infusion, TIVA and Treatment may vary due to age or medical condition  Airway Management Planned: Natural Airway and Simple Face Mask  Additional Equipment: None  Intra-op Plan:   Post-operative Plan:   Informed Consent: I have reviewed the patients History and Physical, chart, labs and discussed the procedure including the risks, benefits and alternatives for the proposed anesthesia with the patient or authorized representative who has indicated his/her understanding and acceptance.     Dental advisory given  Plan Discussed with: CRNA and Anesthesiologist  Anesthesia Plan Comments:        Anesthesia Quick Evaluation

## 2021-03-05 NOTE — Discharge Instructions (Signed)

## 2021-03-05 NOTE — Op Note (Signed)
Operative Note  Preoperative diagnosis:  1.  Elevated PSA  Postoperative diagnosis: 1.  Elevated PSA  Procedure(s): 1.  Transrectal ultrasound-guided prostate biopsy  Surgeon: Ellison Hughs, MD  Assistants:  None  Anesthesia:  General  Complications:  None  EBL: Less than 5 mL  Specimens: 1.  12 needle prostate biopsies  Drains/Catheters: 1.  None  Intraoperative findings:   1. Prostate volume = 95 cm 2. No hypoechoic lesions were identified  Indication:  Michael Bentley is a 70 y.o. male with an elevated PSA value.  He is here today for prostate biopsy for further investigation.  He has been consented for the above procedures, voices understanding and wishes to proceed.  Description of procedure:  After informed consent was obtained, the patient was brought to the operating room and general anesthesia was administered.  The patient was placed in the right lateral decubitus position and prepped in the usual fashion.  A timeout was then performed.  A transrectal ultrasound probe was then inserted into the rectum.  Axial and sagittal images were taken of the prostate.  The prostate volume was found to be approximately 95 cm.  Local anesthetic was injected around the lateral aspects of the prostatic apex.  Needle biopsies were then taken from each sextant of the prostate.  The prostate specimens were sent for pathologic analysis.  The transrectal ultrasound probe was then removed.  The patient tolerated the procedure well and was transferred to the postanesthesia in stable condition.  Plan:  Follow-up on 04/03/2021 to discuss pathology results

## 2021-03-05 NOTE — Transfer of Care (Signed)
Immediate Anesthesia Transfer of Care Note  Patient: Michael Bentley  Procedure(s) Performed: Procedure(s) (LRB): BIOPSY TRANSRECTAL ULTRASONIC PROSTATE (TUBP) (N/A)  Patient Location: PACU  Anesthesia Type: General  Level of Consciousness: awake, sedated, patient cooperative and responds to stimulation  Airway & Oxygen Therapy: Patient Spontanous Breathing and Patient connected to face mask oxygen and soft FM   Post-op Assessment: Report given to PACU RN, Post -op Vital signs reviewed and stable and Patient moving all extremities  Post vital signs: Reviewed and stable  Complications: No apparent anesthesia complications

## 2021-03-05 NOTE — Anesthesia Procedure Notes (Signed)
Procedure Name: MAC Date/Time: 03/05/2021 1:27 PM Performed by: Justice Rocher, CRNA Pre-anesthesia Checklist: Emergency Drugs available, Patient identified, Patient being monitored, Suction available and Timeout performed Patient Re-evaluated:Patient Re-evaluated prior to induction Oxygen Delivery Method: Simple face mask Preoxygenation: Pre-oxygenation with 100% oxygen Induction Type: IV induction Placement Confirmation: positive ETCO2 and breath sounds checked- equal and bilateral

## 2021-03-06 ENCOUNTER — Encounter (HOSPITAL_BASED_OUTPATIENT_CLINIC_OR_DEPARTMENT_OTHER): Payer: Self-pay | Admitting: Urology

## 2021-03-06 NOTE — Anesthesia Postprocedure Evaluation (Signed)
Anesthesia Post Note  Patient: Michael Bentley  Procedure(s) Performed: BIOPSY TRANSRECTAL ULTRASONIC PROSTATE (TUBP) (N/A Prostate)     Patient location during evaluation: PACU Anesthesia Type: MAC Level of consciousness: awake and alert Pain management: pain level controlled Vital Signs Assessment: post-procedure vital signs reviewed and stable Respiratory status: spontaneous breathing and respiratory function stable Cardiovascular status: stable Postop Assessment: no apparent nausea or vomiting Anesthetic complications: no   No complications documented.  Last Vitals:  Vitals:   03/05/21 1430 03/05/21 1459  BP: 113/83 120/88  Pulse: 69 66  Resp: 12 16  Temp:    SpO2: 94% 97%    Last Pain:  Vitals:   03/06/21 1111  TempSrc:   PainSc: 2                  Merlinda Frederick

## 2021-03-07 LAB — SURGICAL PATHOLOGY

## 2021-05-03 IMAGING — CR DG ORBITS FOR FOREIGN BODY
2 series · 2 of 2 positions shown · non-contrast
Comparison: None.

CLINICAL DATA: Metal working/exposure; clearance prior to MRI

EXAM:
ORBITS FOR FOREIGN BODY - 2 VIEW

[w orbit pa (1 of 2)]
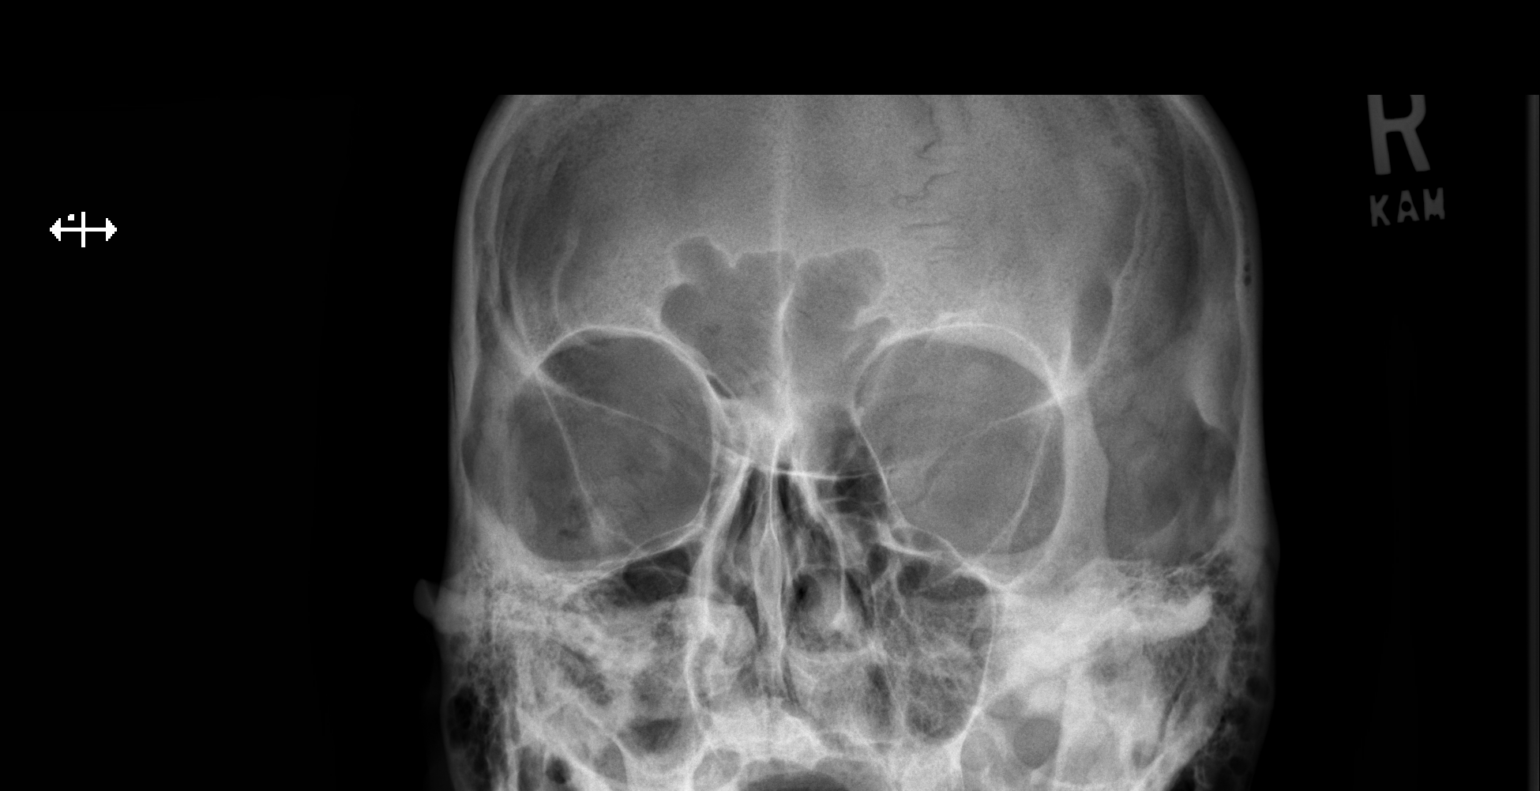

[w orbit pa (2 of 2)]
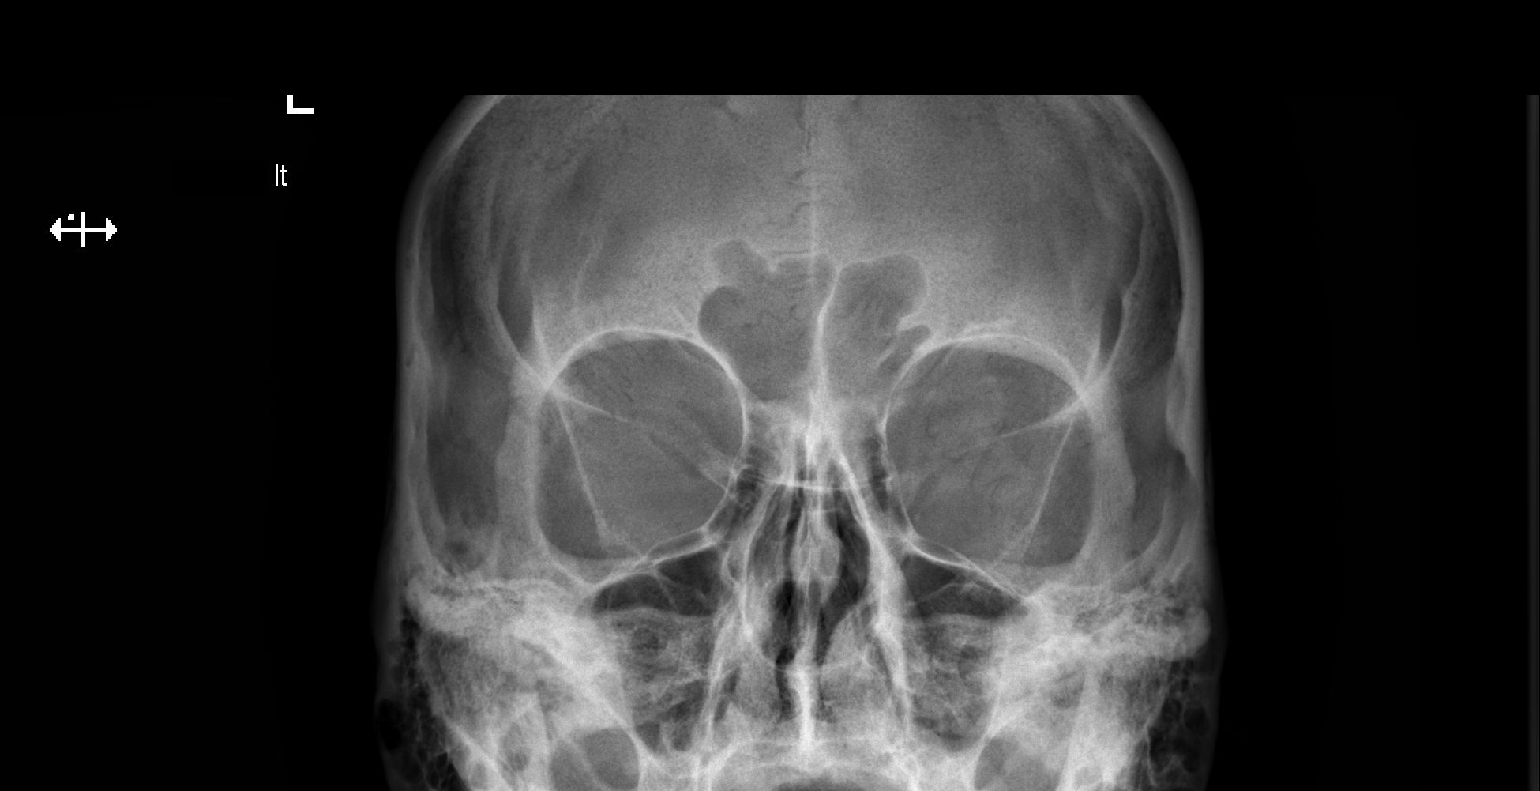

[2 of 2 positions shown; findings below may reference images not displayed]

FINDINGS: There is no evidence of metallic foreign body within the orbits. No
significant bone abnormality identified.
IMPRESSION: No evidence of metallic foreign body within the orbits.

## 2021-08-06 ENCOUNTER — Other Ambulatory Visit: Payer: Self-pay

## 2021-08-06 ENCOUNTER — Ambulatory Visit (INDEPENDENT_AMBULATORY_CARE_PROVIDER_SITE_OTHER): Payer: Medicare PPO

## 2021-08-06 ENCOUNTER — Ambulatory Visit: Payer: Medicare PPO | Admitting: Podiatry

## 2021-08-06 DIAGNOSIS — M778 Other enthesopathies, not elsewhere classified: Secondary | ICD-10-CM | POA: Diagnosis not present

## 2021-08-06 DIAGNOSIS — M7671 Peroneal tendinitis, right leg: Secondary | ICD-10-CM | POA: Diagnosis not present

## 2021-08-06 DIAGNOSIS — M779 Enthesopathy, unspecified: Secondary | ICD-10-CM

## 2021-08-06 MED ORDER — DICLOFENAC SODIUM 75 MG PO TBEC: 75.0000 mg | DELAYED_RELEASE_TABLET | Freq: Two times a day (BID) | ORAL | 2 refills | Status: AC

## 2021-08-06 MED ORDER — TRIAMCINOLONE ACETONIDE 10 MG/ML IJ SUSP
10.0000 mg | Freq: Once | INTRAMUSCULAR | Status: AC
  Administered 2021-08-06: 10 mg

## 2021-08-06 NOTE — Progress Notes (Signed)
Subjective:   Patient ID: Michael Bentley, male   DOB: 70 y.o.   MRN: [id number]   HPI Patient presents with a lot of pain on the right outside of the foot states its been very sore and making walking difficult cult.  States its been there about 6 weeks does not remember specific injury and patient does not smoke currently likes to be active   Review of Systems  All other systems reviewed and are negative.      Objective:  Physical Exam Vitals and nursing note reviewed.  Constitutional:      Appearance: He is well-developed.  Pulmonary:     Effort: Pulmonary effort is normal.  Musculoskeletal:        General: Normal range of motion.  Skin:    General: Skin is warm.  Neurological:     Mental Status: He is alert.    Neurovascular status intact muscle strength found to be adequate range of motion adequate with patient found to have exquisite inflammation pain of the fifth metatarsal base right with fluid buildup no indications of tendon damage.  Patient is found to have good digital perfusion well oriented x3     Assessment:  Acute peroneal tendinitis right with inflammation fluid around the base of the fifth metatarsal and swelling     Plan:  H&P all conditions reviewed sterile prep and injected the fifth metatarsal base right 3 mg Dexasone Kenalog 5 mg Xylocaine and it went ahead today and applied a fascial brace to lift up the lateral side of the foot along with aggressive ice and shoe gear modifications.  Reappoint to recheck  X-rays indicate there is some reactivity around the base of the fifth metatarsal no indications of fracture or other bone pathology

## 2021-08-20 ENCOUNTER — Ambulatory Visit: Payer: Medicare PPO | Admitting: Podiatry
